# Patient Record
Sex: Male | Born: 1995 | Race: Black or African American | Hispanic: No | Marital: Single | Smoking: Never smoker
Health system: Southern US, Community
[De-identification: ages and names within clinical notes are randomized; demographics above are authoritative.]

## PROBLEM LIST (undated history)

## (undated) ENCOUNTER — Emergency Department: Admission: EM | Payer: Self-pay

## (undated) DIAGNOSIS — E119 Type 2 diabetes mellitus without complications: Secondary | ICD-10-CM

---
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
## 2016-12-10 ENCOUNTER — Ambulatory Visit: Payer: Self-pay | Admitting: Adult Health Nurse Practitioner

## 2016-12-10 DIAGNOSIS — E119 Type 2 diabetes mellitus without complications: Secondary | ICD-10-CM

## 2016-12-10 DIAGNOSIS — Z0000 Encounter for general adult medical examination without abnormal findings: Secondary | ICD-10-CM | POA: Insufficient documentation

## 2016-12-10 NOTE — Addendum Note (Signed)
Addended by: Dustin FlockMAYNOR, Shyrl Obi D on: 12/10/2016 07:46 PM   Modules accepted: Orders

## 2016-12-10 NOTE — Progress Notes (Signed)
  Patient: Peter Barron Male    DOB: 09/01/1995   21 y.o.   MRN: [id number] Visit Date: 12/10/2016  Today's Provider: [facility]   Chief Complaint  Patient presents with  . Diabetes   Subjective:    HPI  Pt states that he is not taking any medications at this time.  Was diagnosed with DM at age 21.  Pt states he was on 70/30 40 in the am and 20 at night.  Reports sugar has been running 160-170 in the am.  Not experiencing hypoglycemia.      Grandma with DM, CVA.  No family hx of cancer.   No Known Allergies Previous Medications   No medications on file    Review of Systems  All other systems reviewed and are negative.   Social History  Substance Use Topics  . Smoking status: Never Smoker  . Smokeless tobacco: Never Used  . Alcohol use Not on file   Objective:   BP (!) 144/84   Pulse 66   Temp 99.3 F (37.4 C)   Wt 207 lb (93.9 kg)   Physical Exam  Constitutional: He is oriented to person, place, and time. He appears well-developed and well-nourished.  HENT:  Head: Normocephalic and atraumatic.  Eyes: Pupils are equal, round, and reactive to light.  Neck: Normal range of motion. Neck supple.  Cardiovascular: Normal rate, regular rhythm and normal heart sounds.   Pulmonary/Chest: Effort normal and breath sounds normal.  Abdominal: Soft. Bowel sounds are normal.  Musculoskeletal: Normal range of motion.  Neurological: He is oriented to person, place, and time.  Skin: Skin is warm and dry.  Psychiatric: He has a normal mood and affect.  Vitals reviewed.       Assessment & Plan:     Routine labs ordered.   Continue to check CBG fasting.  Meter given.   FU in 2 weeks for BP check and to review labs.       [facility]   [facility] [place]

## 2016-12-10 NOTE — Addendum Note (Signed)
Addended by: Dustin FlockMAYNOR, Terrilee Dudzik D on: 12/10/2016 07:41 PM   Modules accepted: Orders

## 2016-12-11 LAB — COMPREHENSIVE METABOLIC PANEL
A/G RATIO: 1.6 (ref 1.2–2.2)
ALK PHOS: 61 IU/L (ref 39–117)
ALT: 10 IU/L (ref 0–44)
AST: 12 IU/L (ref 0–40)
Albumin: 4.8 g/dL (ref 3.5–5.5)
BILIRUBIN TOTAL: 0.4 mg/dL (ref 0.0–1.2)
BUN/Creatinine Ratio: 14 (ref 9–20)
BUN: 13 mg/dL (ref 6–20)
CALCIUM: 9.6 mg/dL (ref 8.7–10.2)
CHLORIDE: 101 mmol/L (ref 96–106)
CO2: 24 mmol/L (ref 20–29)
Creatinine, Ser: 0.93 mg/dL (ref 0.76–1.27)
GFR calc Af Amer: 135 mL/min/{1.73_m2} (ref >59)
GFR calc non Af Amer: 117 mL/min/{1.73_m2} (ref >59)
GLOBULIN, TOTAL: 3 g/dL (ref 1.5–4.5)
Glucose: 94 mg/dL (ref 65–99)
POTASSIUM: 4.3 mmol/L (ref 3.5–5.2)
SODIUM: 143 mmol/L (ref 134–144)
Total Protein: 7.8 g/dL (ref 6.0–8.5)

## 2016-12-11 LAB — CBC
HEMOGLOBIN: 14.8 g/dL (ref 13.0–17.7)
Hematocrit: 45.1 % (ref 37.5–51.0)
MCH: 27.9 pg (ref 26.6–33.0)
MCHC: 32.8 g/dL (ref 31.5–35.7)
MCV: 85 fL (ref 79–97)
PLATELETS: 235 10*3/uL (ref 150–379)
RBC: 5.3 x10E6/uL (ref 4.14–5.80)
RDW: 13.7 % (ref 12.3–15.4)
WBC: 6.6 10*3/uL (ref 3.4–10.8)

## 2016-12-11 LAB — HEMOGLOBIN A1C
ESTIMATED AVERAGE GLUCOSE: 128 mg/dL
HEMOGLOBIN A1C: 6.1 % — AB (ref 4.8–5.6)

## 2016-12-11 LAB — TSH: TSH: 2.23 u[IU]/mL (ref 0.450–4.500)

## 2016-12-24 ENCOUNTER — Ambulatory Visit: Payer: Self-pay | Admitting: Adult Health Nurse Practitioner

## 2016-12-24 VITALS — BP 140/77 | HR 64 | Wt 207.0 lb

## 2016-12-24 DIAGNOSIS — E119 Type 2 diabetes mellitus without complications: Secondary | ICD-10-CM

## 2016-12-24 LAB — GLUCOSE, POCT (MANUAL RESULT ENTRY): POC Glucose: 73 mg/dL (ref 70–99)

## 2016-12-24 MED ORDER — INSULIN ASPART PROT & ASPART (70-30 MIX) 100 UNIT/ML ~~LOC~~ SUSP: SUBCUTANEOUS | 11 refills | Status: AC

## 2016-12-24 NOTE — Progress Notes (Signed)
  Patient: Peter Barron Male    DOB: Mar 22, 1996   20 y.o.   MRN: [id number] Visit Date: 12/24/2016  Today's Provider: Jacelyn Pieah Doles-Johnson, NP   Chief Complaint  Patient presents with  . Diabetes   Subjective:    HPI  Here for BP check and lab review.  Last visit BP was 144/84.  Denies HA, dizziness.    A1C is 6.1  Taking 70/30 40 units in the am and 20 at night.  Denies any hypoglycemia.      No Known Allergies Previous Medications   No medications on file    Review of Systems  All other systems reviewed and are negative.   Social History  Substance Use Topics  . Smoking status: Never Smoker  . Smokeless tobacco: Never Used  . Alcohol use Not on file   Objective:   BP 140/77 (BP Location: Left Arm, Patient Position: Sitting)   Pulse 64   Wt 207 lb (93.9 kg)   Physical Exam  Constitutional: He appears well-developed and well-nourished.  Cardiovascular: Normal rate, regular rhythm and normal heart sounds.   Pulmonary/Chest: Effort normal and breath sounds normal.  Vitals reviewed.       Assessment & Plan:         DM:  Well Controlled.  Encourage diabetic diet and exercise.  Decrease 70/30 to 30 units in the am and 20 units at night.  Keep check CBG.  Would like 70/30 in a pen if available.    BP improved but still suboptimal.  Encouraged strict diet.  If not improved will start on HCTZ.    Jacelyn Pieah Doles-Johnson, NP   [facility] [place]

## 2016-12-24 NOTE — Patient Instructions (Signed)
DASH Eating Plan DASH stands for "Dietary Approaches to Stop Hypertension." The DASH eating plan is a healthy eating plan that has been shown to reduce high blood pressure (hypertension). It may also reduce your risk for type 2 diabetes, heart disease, and stroke. The DASH eating plan may also help with weight loss. What are tips for following this plan? General guidelines  Avoid eating more than 2,300 mg (milligrams) of salt (sodium) a day. If you have hypertension, you may need to reduce your sodium intake to 1,500 mg a day.  Limit alcohol intake to no more than 1 drink a day for nonpregnant women and 2 drinks a day for men. One drink equals 12 oz of beer, 5 oz of wine, or 1 oz of hard liquor.  Work with your health care provider to maintain a healthy body weight or to lose weight. Ask what an ideal weight is for you.  Get at least 30 minutes of exercise that causes your heart to beat faster (aerobic exercise) most days of the week. Activities may include walking, swimming, or biking.  Work with your health care provider or diet and nutrition specialist (dietitian) to adjust your eating plan to your individual calorie needs. Reading food labels  Check food labels for the amount of sodium per serving. Choose foods with less than 5 percent of the Daily Value of sodium. Generally, foods with less than 300 mg of sodium per serving fit into this eating plan.  To find whole grains, look for the word "whole" as the first word in the ingredient list. Shopping  Buy products labeled as "low-sodium" or "no salt added."  Buy fresh foods. Avoid canned foods and premade or frozen meals. Cooking  Avoid adding salt when cooking. Use salt-free seasonings or herbs instead of table salt or sea salt. Check with your health care provider or pharmacist before using salt substitutes.  Do not fry foods. Cook foods using healthy methods such as baking, boiling, grilling, and broiling instead.  Cook with  heart-healthy oils, such as olive, canola, soybean, or sunflower oil. Meal planning   Eat a balanced diet that includes: ? 5 or more servings of fruits and vegetables each day. At each meal, try to fill half of your plate with fruits and vegetables. ? Up to 6-8 servings of whole grains each day. ? Less than 6 oz of lean meat, poultry, or fish each day. A 3-oz serving of meat is about the same size as a deck of cards. One egg equals 1 oz. ? 2 servings of low-fat dairy each day. ? A serving of nuts, seeds, or beans 5 times each week. ? Heart-healthy fats. Healthy fats called Omega-3 fatty acids are found in foods such as flaxseeds and coldwater fish, like sardines, salmon, and mackerel.  Limit how much you eat of the following: ? Canned or prepackaged foods. ? Food that is high in trans fat, such as fried foods. ? Food that is high in saturated fat, such as fatty meat. ? Sweets, desserts, sugary drinks, and other foods with added sugar. ? Full-fat dairy products.  Do not salt foods before eating.  Try to eat at least 2 vegetarian meals each week.  Eat more home-cooked food and less restaurant, buffet, and fast food.  When eating at a restaurant, ask that your food be prepared with less salt or no salt, if possible. What foods are recommended? The items listed may not be a complete list. Talk with your dietitian about what   dietary choices are best for you. Grains Whole-grain or whole-wheat bread. Whole-grain or whole-wheat pasta. Brown rice. Oatmeal. Quinoa. Bulgur. Whole-grain and low-sodium cereals. Pita bread. Low-fat, low-sodium crackers. Whole-wheat flour tortillas. Vegetables Fresh or frozen vegetables (raw, steamed, roasted, or grilled). Low-sodium or reduced-sodium tomato and vegetable juice. Low-sodium or reduced-sodium tomato sauce and tomato paste. Low-sodium or reduced-sodium canned vegetables. Fruits All fresh, dried, or frozen fruit. Canned fruit in natural juice (without  added sugar). Meat and other protein foods Skinless chicken or turkey. Ground chicken or turkey. Pork with fat trimmed off. Fish and seafood. Egg whites. Dried beans, peas, or lentils. Unsalted nuts, nut butters, and seeds. Unsalted canned beans. Lean cuts of beef with fat trimmed off. Low-sodium, lean deli meat. Dairy Low-fat (1%) or fat-free (skim) milk. Fat-free, low-fat, or reduced-fat cheeses. Nonfat, low-sodium ricotta or cottage cheese. Low-fat or nonfat yogurt. Low-fat, low-sodium cheese. Fats and oils Soft margarine without trans fats. Vegetable oil. Low-fat, reduced-fat, or light mayonnaise and salad dressings (reduced-sodium). Canola, safflower, olive, soybean, and sunflower oils. Avocado. Seasoning and other foods Herbs. Spices. Seasoning mixes without salt. Unsalted popcorn and pretzels. Fat-free sweets. What foods are not recommended? The items listed may not be a complete list. Talk with your dietitian about what dietary choices are best for you. Grains Baked goods made with fat, such as croissants, muffins, or some breads. Dry pasta or rice meal packs. Vegetables Creamed or fried vegetables. Vegetables in a cheese sauce. Regular canned vegetables (not low-sodium or reduced-sodium). Regular canned tomato sauce and paste (not low-sodium or reduced-sodium). Regular tomato and vegetable juice (not low-sodium or reduced-sodium). Pickles. Olives. Fruits Canned fruit in a light or heavy syrup. Fried fruit. Fruit in cream or butter sauce. Meat and other protein foods Fatty cuts of meat. Ribs. Fried meat. Bacon. Sausage. Bologna and other processed lunch meats. Salami. Fatback. Hotdogs. Bratwurst. Salted nuts and seeds. Canned beans with added salt. Canned or smoked fish. Whole eggs or egg yolks. Chicken or turkey with skin. Dairy Whole or 2% milk, cream, and half-and-half. Whole or full-fat cream cheese. Whole-fat or sweetened yogurt. Full-fat cheese. Nondairy creamers. Whipped toppings.  Processed cheese and cheese spreads. Fats and oils Butter. Stick margarine. Lard. Shortening. Ghee. Bacon fat. Tropical oils, such as coconut, palm kernel, or palm oil. Seasoning and other foods Salted popcorn and pretzels. Onion salt, garlic salt, seasoned salt, table salt, and sea salt. Worcestershire sauce. Tartar sauce. Barbecue sauce. Teriyaki sauce. Soy sauce, including reduced-sodium. Steak sauce. Canned and packaged gravies. Fish sauce. Oyster sauce. Cocktail sauce. Horseradish that you find on the shelf. Ketchup. Mustard. Meat flavorings and tenderizers. Bouillon cubes. Hot sauce and Tabasco sauce. Premade or packaged marinades. Premade or packaged taco seasonings. Relishes. Regular salad dressings. Where to find more information:  National Heart, Lung, and Blood Institute: www.nhlbi.nih.gov  American Heart Association: www.heart.org Summary  The DASH eating plan is a healthy eating plan that has been shown to reduce high blood pressure (hypertension). It may also reduce your risk for type 2 diabetes, heart disease, and stroke.  With the DASH eating plan, you should limit salt (sodium) intake to 2,300 mg a day. If you have hypertension, you may need to reduce your sodium intake to 1,500 mg a day.  When on the DASH eating plan, aim to eat more fresh fruits and vegetables, whole grains, lean proteins, low-fat dairy, and heart-healthy fats.  Work with your health care provider or diet and nutrition specialist (dietitian) to adjust your eating plan to your individual   calorie needs. This information is not intended to replace advice given to you by your health care provider. Make sure you discuss any questions you have with your health care provider. Document Released: 05/14/2011 Document Revised: 05/18/2016 Document Reviewed: 05/18/2016 Elsevier Interactive Patient Education  2017 Elsevier Inc.  

## 2017-01-05 ENCOUNTER — Ambulatory Visit: Payer: Self-pay | Admitting: Pharmacy Technician

## 2017-01-05 NOTE — Progress Notes (Signed)
Patient scheduled for eligibility appointment at [facility].  Patient did not show for the appointment on January 05, 2017 at 9:00a.m.  Called patient.  Patient stated that he wanted to sleep-in.  [facility] to [facility]che[facility] appointment after he woke-up.  Patient to come-in for eligibility on 01/19/17 at 3:30p.m.  [facility] unable to provide additional medication assistance until eligibility is determined.  Sherilyn DacostaBetty J. Chaunice Obie Care Manager [facility]

## 2017-01-19 ENCOUNTER — Ambulatory Visit: Payer: Self-pay | Admitting: Pharmacy Technician

## 2017-02-18 ENCOUNTER — Telehealth: Payer: Self-pay | Admitting: Pharmacy Technician

## 2017-02-18 NOTE — Telephone Encounter (Signed)
Patient eligible to receive medication assistance at [facility] through 2018, as long as eligibility requirements continue to be met.  Peter Barron Care Manager [facility] 

## 2017-03-15 ENCOUNTER — Ambulatory Visit: Payer: Self-pay

## 2017-04-01 ENCOUNTER — Ambulatory Visit: Payer: Self-pay

## 2017-05-10 ENCOUNTER — Telehealth: Payer: Self-pay | Admitting: Pharmacist

## 2017-05-10 NOTE — Telephone Encounter (Signed)
05/10/17 Mailing Denial letter to patient, we sent paperwork to patient 02/18/17 for patient to sign and return with a Medicaid denial for full Medicaid. Patient has not returned his portion. Also noting in [facility]Forde RadonAJ

## 2017-08-13 ENCOUNTER — Telehealth: Payer: Self-pay | Admitting: Pharmacy Technician

## 2017-08-13 NOTE — Telephone Encounter (Signed)
Patient failed to provide 2019 financial documentation.  No additional medication assistance will be provided by [facility] without the required proof of income documentation.  Patient notified by letter.  Betty J. Kluttz Care Manager [facility] 

## 2018-03-23 ENCOUNTER — Telehealth: Payer: Self-pay

## 2018-03-23 NOTE — Telephone Encounter (Signed)
-----   Message from Rolm Gala, NP sent at 03/15/2018  2:44 PM EDT ----- Please call and schedule an appointment for Mr Tozzi. Thank you

## 2018-03-23 NOTE — Telephone Encounter (Signed)
Called pt to see if pt would like to make a follow up appt, pt has not been seen in awhile. Wrong number in system.

## 2021-01-15 ENCOUNTER — Other Ambulatory Visit: Payer: Self-pay

## 2021-01-15 ENCOUNTER — Encounter: Payer: Self-pay | Admitting: Emergency Medicine

## 2021-01-15 DIAGNOSIS — Z794 Long term (current) use of insulin: Secondary | ICD-10-CM | POA: Insufficient documentation

## 2021-01-15 DIAGNOSIS — E1165 Type 2 diabetes mellitus with hyperglycemia: Secondary | ICD-10-CM | POA: Insufficient documentation

## 2021-01-15 DIAGNOSIS — Z7984 Long term (current) use of oral hypoglycemic drugs: Secondary | ICD-10-CM | POA: Insufficient documentation

## 2021-01-15 LAB — BASIC METABOLIC PANEL
Anion gap: 4 — ABNORMAL LOW (ref 5–15)
BUN: 8 mg/dL (ref 6–20)
CO2: 30 mmol/L (ref 22–32)
Calcium: 9.2 mg/dL (ref 8.9–10.3)
Chloride: 110 mmol/L (ref 98–111)
Creatinine, Ser: 0.77 mg/dL (ref 0.61–1.24)
GFR, Estimated: >60 mL/min (ref >60)
Glucose, Bld: 125 mg/dL — ABNORMAL HIGH (ref 70–99)
Potassium: 3.8 mmol/L (ref 3.5–5.1)
Sodium: 144 mmol/L (ref 135–145)

## 2021-01-15 LAB — CBC
HCT: 42.1 % (ref 39.0–52.0)
Hemoglobin: 14.6 g/dL (ref 13.0–17.0)
MCH: 29.3 pg (ref 26.0–34.0)
MCHC: 34.7 g/dL (ref 30.0–36.0)
MCV: 84.5 fL (ref 80.0–100.0)
Platelets: 261 10*3/uL (ref 150–400)
RBC: 4.98 MIL/uL (ref 4.22–5.81)
RDW: 13 % (ref 11.5–15.5)
WBC: 7.5 10*3/uL (ref 4.0–10.5)
nRBC: 0 % (ref 0.0–0.2)

## 2021-01-15 LAB — CBG MONITORING, ED: Glucose-Capillary: 109 mg/dL — ABNORMAL HIGH (ref 70–99)

## 2021-01-15 NOTE — ED Triage Notes (Signed)
Pt in via POV, reports sugars reading "high" x 2 days.  Reports being insulin dependent diabetic, out of insulin x approximately 2 months.  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  Vitals WDL, NAD noted.

## 2021-01-16 ENCOUNTER — Emergency Department
Admission: EM | Admit: 2021-01-16 | Discharge: 2021-01-16 | Disposition: A | Discharge status: Home / Self Care | Payer: Self-pay | Attending: Emergency Medicine | Admitting: Emergency Medicine

## 2021-01-16 DIAGNOSIS — R739 Hyperglycemia, unspecified: Secondary | ICD-10-CM

## 2021-01-16 HISTORY — DX: Type 2 diabetes mellitus without complications: E11.9

## 2021-01-16 LAB — URINALYSIS, COMPLETE (UACMP) WITH MICROSCOPIC
Bacteria, UA: NONE SEEN
Bilirubin Urine: NEGATIVE
Glucose, UA: 50 mg/dL — AB
Hgb urine dipstick: NEGATIVE
Ketones, ur: NEGATIVE mg/dL
Leukocytes,Ua: NEGATIVE
Nitrite: NEGATIVE
Protein, ur: NEGATIVE mg/dL
Specific Gravity, Urine: 1.017 (ref 1.005–1.030)
Squamous Epithelial / HPF: NONE SEEN (ref 0–5)
pH: 6 (ref 5.0–8.0)

## 2021-01-16 MED ORDER — METFORMIN HCL 500 MG PO TABS: 500.0000 mg | ORAL_TABLET | Freq: Two times a day (BID) | ORAL | 1 refills | Status: AC

## 2021-01-16 NOTE — ED Provider Notes (Signed)
[facility] Emergency Department Provider Note  ____________________________________________   Event Date/Time   First MD Initiated Contact with Patient 01/16/21 0040     (approximate)  I have reviewed the triage vital signs and the nursing notes.   HISTORY  Chief Complaint Hyperglycemia    HPI Peter Barron is a 25 y.o. male with history of type 2 insulin-dependent diabetes who presents to the emergency department concerns for hyperglycemia.  Reports over this past several days he has had fasting blood sugars that have read "high".  He has been out of his medications for about 2 months.  States he was on Humulin 70/30 and took 45 units in the morning and 25 units at night.  States he just moved here from [place] and does not yet have a primary care doctor or insurance.  States he has had intermittent blurry vision in the morning when his blood sugars are high as well as polyuria.  No polydipsia, nausea or vomiting.  No symptoms currently.  States he was hoping to get a prescription for metformin until he can get insurance and a primary care doctor.       Past Medical History:  Diagnosis Date   Diabetes mellitus without complication [facility])     Patient Active Problem List   Diagnosis Date Noted   Diabetes mellitus without complication (HCC) 12/10/2016   Healthcare maintenance 12/10/2016    History reviewed. No pertinent surgical history.  Prior to Admission medications   Medication Sig Start Date End Date Taking? Authorizing Provider  metFORMIN (GLUCOPHAGE) 500 MG tablet Take 1 tablet (500 mg total) by mouth 2 (two) times daily with a meal. 01/16/21 01/16/22 Yes Ola Raap, Baxter Hire N, DO  insulin aspart protamine- aspart (NOVOLOG MIX 70/30) (70-30) 100 UNIT/ML injection 30 units in the morning, 20 units at night 12/24/16   Doles-Johnson, Teah, NP    Allergies Patient has no known allergies.  No family history on file.  Social History Social  History   Tobacco Use   Smoking status: Never   Smokeless tobacco: Never  Vaping Use   Vaping Use: Never used  Substance Use Topics   Alcohol use: Never   Drug use: Never    Review of Systems Constitutional: No fever. Eyes: No visual changes. ENT: No sore throat. Cardiovascular: Denies chest pain. Respiratory: Denies shortness of breath. Gastrointestinal: No nausea, vomiting, diarrhea. Genitourinary: Negative for dysuria. Musculoskeletal: Negative for back pain. Skin: Negative for rash. Neurological: Negative for focal weakness or numbness.  ____________________________________________   PHYSICAL EXAM:  VITAL SIGNS: ED Triage Vitals  Enc Vitals Group     BP 01/15/21 2130 138/79     Pulse Rate 01/15/21 2130 75     Resp 01/15/21 2130 16     Temp 01/15/21 2130 99 F (37.2 C)     Temp Source 01/15/21 2130 Oral     SpO2 01/15/21 2130 97 %     Weight 01/15/21 2131 200 lb (90.7 kg)     Height 01/15/21 2131 6' (1.829 m)     Head Circumference --      Peak Flow --      Pain Score 01/15/21 2131 0     Pain Loc --      Pain Edu? --      Excl. in GC? --    CONSTITUTIONAL: Alert and oriented and responds appropriately to questions. Well-appearing; well-nourished HEAD: Normocephalic EYES: Conjunctivae clear, pupils appear equal, EOM appear intact ENT: normal nose; moist mucous  membranes NECK: Supple, normal ROM CARD: RRR; S1 and S2 appreciated; no murmurs, no clicks, no rubs, no gallops RESP: Normal chest excursion without splinting or tachypnea; breath sounds clear and equal bilaterally; no wheezes, no rhonchi, no rales, no hypoxia or respiratory distress, speaking full sentences ABD/GI: Normal bowel sounds; non-distended; soft, non-tender, no rebound, no guarding, no peritoneal signs, no hepatosplenomegaly BACK: The back appears normal EXT: Normal ROM in all joints; no deformity noted, no edema; no cyanosis SKIN: Normal color for age and race; warm; no rash on exposed  skin NEURO: Moves all extremities equally PSYCH: The patient's mood and manner are appropriate.  ____________________________________________   LABS (all labs ordered are listed, but only abnormal results are displayed)  Labs Reviewed  BASIC METABOLIC PANEL - Abnormal; Notable for the following components:      Result Value   Glucose, Bld 125 (*)    Anion gap 4 (*)    All other components within normal limits  CBG MONITORING, ED - Abnormal; Notable for the following components:   Glucose-Capillary 109 (*)    All other components within normal limits  CBC  URINALYSIS, COMPLETE (UACMP) WITH MICROSCOPIC  CBG MONITORING, ED  CBG MONITORING, ED   ____________________________________________  EKG   ____________________________________________  RADIOLOGY I, Eulas Schweitzer, personally viewed and evaluated these images (plain radiographs) as part of my medical decision making, as well as reviewing the written report by the radiologist.  ED MD interpretation:    Official radiology report(s): No results found.  ____________________________________________   PROCEDURES  Procedure(s) performed (including Critical Care):  Procedures   ____________________________________________   INITIAL IMPRESSION / ASSESSMENT AND PLAN / ED COURSE  As part of my medical decision making, I reviewed the following data within the electronic MEDICAL RECORD NUMBER Nursing notes reviewed and incorporated and Labs reviewed          Patient here with complaints of hyperglycemia.  Currently blood sugars in the low 100s.  He is asymptomatic.  Labs are not suggestive of DKA.  Discussed with patient that I am happy to prescribe him metformin until he can get a primary care doctor given his blood sugar is well controlled currently that he may not need insulin.  He verbalized understanding.  Will provide with work note as well for today.  Discussed return precautions.  Patient comfortable with this  plan.  At this time, I do not feel there is any life-threatening condition present. I have reviewed, interpreted and discussed all results (EKG, imaging, lab, urine as appropriate) and exam findings with patient/family. I have reviewed nursing notes and appropriate previous records.  I feel the patient is safe to be discharged home without further emergent workup and can continue workup as an outpatient as needed. Discussed usual and customary return precautions. Patient/family verbalize understanding and are comfortable with this plan.  Outpatient follow-up has been provided as needed. All questions have been answered.  ____________________________________________   FINAL CLINICAL IMPRESSION(S) / ED DIAGNOSES  Final diagnoses:  Hyperglycemia     ED Discharge Orders          Ordered    metFORMIN (GLUCOPHAGE) 500 MG tablet  2 times daily with meals        01/16/21 0046            *Please note:  Peter Barron was evaluated in Emergency Department on 01/16/2021 for the symptoms described in the history of present illness. He was evaluated in the context of the global COVID-19 pandemic,  which necessitated consideration that the patient might be at risk for infection with the SARS-CoV-2 virus that causes COVID-19. Institutional protocols and algorithms that pertain to the evaluation of patients at risk for COVID-19 are in a state of rapid change based on information released by regulatory bodies including the CDC and federal and state organizations. These policies and algorithms were followed during the patient's care in the ED.  Some ED evaluations and interventions may be delayed as a result of limited staffing during and the pandemic.*   Note:  This document was prepared using Dragon voice recognition software and may include unintentional dictation errors.    Thea Holshouser, Layla Maw, DO 01/16/21 [phone]

## 2021-01-16 NOTE — Discharge Instructions (Addendum)
Steps to find a Primary Care Provider (PCP):  Call [phone] or [phone] to access "[facility] Find a Doctor Service."  2.  You may also go on the [facility] website at [email or web address]  

## 2021-05-18 ENCOUNTER — Encounter: Payer: Self-pay | Admitting: Emergency Medicine

## 2021-05-18 ENCOUNTER — Emergency Department
Admission: EM | Admit: 2021-05-18 | Discharge: 2021-05-18 | Disposition: A | Discharge status: Home / Self Care | Payer: Self-pay | Attending: Emergency Medicine | Admitting: Emergency Medicine

## 2021-05-18 ENCOUNTER — Other Ambulatory Visit: Payer: Self-pay

## 2021-05-18 DIAGNOSIS — J069 Acute upper respiratory infection, unspecified: Secondary | ICD-10-CM | POA: Insufficient documentation

## 2021-05-18 DIAGNOSIS — E119 Type 2 diabetes mellitus without complications: Secondary | ICD-10-CM | POA: Insufficient documentation

## 2021-05-18 DIAGNOSIS — Z20822 Contact with and (suspected) exposure to covid-19: Secondary | ICD-10-CM | POA: Insufficient documentation

## 2021-05-18 DIAGNOSIS — J40 Bronchitis, not specified as acute or chronic: Secondary | ICD-10-CM | POA: Insufficient documentation

## 2021-05-18 DIAGNOSIS — Z7984 Long term (current) use of oral hypoglycemic drugs: Secondary | ICD-10-CM | POA: Insufficient documentation

## 2021-05-18 DIAGNOSIS — Z794 Long term (current) use of insulin: Secondary | ICD-10-CM | POA: Insufficient documentation

## 2021-05-18 LAB — RESP PANEL BY RT-PCR (FLU A&B, COVID) ARPGX2
Influenza A by PCR: NEGATIVE
Influenza B by PCR: NEGATIVE
SARS Coronavirus 2 by RT PCR: NEGATIVE

## 2021-05-18 MED ORDER — DOXYCYCLINE MONOHYDRATE 50 MG PO CAPS
100.0000 mg | ORAL_CAPSULE | Freq: Two times a day (BID) | ORAL | 0 refills | Status: AC
  Filled 2021-05-18: qty 20, 5d supply, fill #0

## 2021-05-18 NOTE — ED Triage Notes (Signed)
Pt reports sore throat, fever, cough and congestion for over a week. Pt states does feel better than he did last week but still feels bad

## 2021-05-18 NOTE — ED Provider Notes (Signed)
[facility] Emergency Department Provider Note   ____________________________________________   Event Date/Time   First MD Initiated Contact with Patient 05/18/21 1827     (approximate)  I have reviewed the triage vital signs and the nursing notes.   HISTORY  Chief Complaint Fever, Cough, and Chills    HPI Peter Barron is a 25 y.o. male who presents for fever, cough, and chills  LOCATION: Generalized DURATION: 1 week prior to arrival TIMING: Worsening since onset SEVERITY: Severe QUALITY: Fever, cough, and chills CONTEXT: Patient states that he began having fever, cough, and chills approximately 1 week prior to arrival that initially improved but now is worsening MODIFYING FACTORS: Denies any exacerbating or relieving factors and has taken no medications for the symptoms ASSOCIATED SYMPTOMS: Clear rhinorrhea   Per medical record review, patient has history of type 2 diabetes          Past Medical History:  Diagnosis Date   Diabetes mellitus without complication [facility])     Patient Active Problem List   Diagnosis Date Noted   Diabetes mellitus without complication (HCC) 12/10/2016   Healthcare maintenance 12/10/2016    History reviewed. No pertinent surgical history.  Prior to Admission medications   Medication Sig Start Date End Date Taking? Authorizing Provider  doxycycline (VIBRAMYCIN) 50 MG capsule Take 2 capsules (100 mg total) by mouth 2 (two) times daily for 5 days. 05/18/21 05/23/21 Yes Merwyn Katos, MD  insulin aspart protamine- aspart (NOVOLOG MIX 70/30) (70-30) 100 UNIT/ML injection 30 units in the morning, 20 units at night 12/24/16   Doles-Johnson, Teah, NP  metFORMIN (GLUCOPHAGE) 500 MG tablet Take 1 tablet (500 mg total) by mouth 2 (two) times daily with a meal. 01/16/21 01/16/22  Ward, Layla Maw, DO    Allergies Patient has no known allergies.  No family history on file.  Social History Social History    Tobacco Use   Smoking status: Never   Smokeless tobacco: Never  Vaping Use   Vaping Use: Never used  Substance Use Topics   Alcohol use: Never   Drug use: Never    Review of Systems Constitutional: Endorses fever/chills Eyes: No visual changes. ENT: Endorses sore throat, and cough Cardiovascular: Denies chest pain. Respiratory: Denies shortness of breath. Gastrointestinal: No abdominal pain.  No nausea, no vomiting.  No diarrhea. Genitourinary: Negative for dysuria. Musculoskeletal: Negative for acute arthralgias Skin: Negative for rash. Neurological: Negative for headaches, weakness/numbness/paresthesias in any extremity Psychiatric: Negative for suicidal ideation/homicidal ideation   ____________________________________________   PHYSICAL EXAM:  VITAL SIGNS: ED Triage Vitals  Enc Vitals Group     BP 05/18/21 2013 115/85     Pulse Rate 05/18/21 2013 82     Resp 05/18/21 2013 18     Temp 05/18/21 2013 98.5 F (36.9 C)     Temp Source 05/18/21 2013 Oral     SpO2 05/18/21 2013 97 %     Weight 05/18/21 1541 210 lb (95.3 kg)     Height 05/18/21 1541 6' (1.829 m)     Head Circumference --      Peak Flow --      Pain Score 05/18/21 1540 4     Pain Loc --      Pain Edu? --      Excl. in GC? --    Constitutional: Alert and oriented. Well appearing and in no acute distress. Eyes: Conjunctivae are normal. PERRL. Head: Atraumatic. Nose: No congestion/rhinnorhea. Mouth/Throat: Mucous membranes are moist.  Neck: No stridor Cardiovascular: Grossly normal heart sounds.  Good peripheral circulation. Respiratory: Normal respiratory effort.  No retractions. Gastrointestinal: Soft and nontender. No distention. Musculoskeletal: No obvious deformities Neurologic:  Normal speech and language. No gross focal neurologic deficits are appreciated. Skin:  Skin is warm and diaphoretic. No rash noted. Psychiatric: Mood and affect are normal. Speech and behavior are  normal.  ____________________________________________   LABS (all labs ordered are listed, but only abnormal results are displayed)  Labs Reviewed  RESP PANEL BY RT-PCR (FLU A&B, COVID) [id number]    PROCEDURES  Procedure(s) performed (including Critical Care):  .1-3 Lead EKG Interpretation Performed by: Merwyn Katos, MD Authorized by: Merwyn Katos, MD     Interpretation: normal     ECG rate:  81   ECG rate assessment: normal     Rhythm: sinus rhythm     Ectopy: none     Conduction: normal     ____________________________________________   INITIAL IMPRESSION / ASSESSMENT AND PLAN / ED COURSE  As part of my medical decision making, I reviewed the following data within the electronic medical record, if available:  Nursing notes reviewed and incorporated, Labs reviewed, EKG interpreted, Old chart reviewed, Radiograph reviewed and Notes from prior ED visits reviewed and incorporated        Otherwise healthy patient presenting with constellation of symptoms likely representing uncomplicated viral upper respiratory symptoms as characterized by mild pharyngitis  Unlikely PTA/RPA: no hot potato voice, no uvular deviation, Unlikely Esophageal rupture: No history of dysphagia Unlikely deep space infection/Ludwigs Low suspicion for CNS infection bacterial sinusitis, or pneumonia given exam and history.  Unlikely Strep or EBV as centor negative and with no pharyngeal exudate, posterior LAD, or splenomegaly.  Will attempt to alleviate symptoms conservatively; no overt indications at this time for antibiotics. No respiratory distress, otherwise relatively well appearing and nontoxic. Will discuss prompt follow up with PMD and strict return precautions.      ____________________________________________   FINAL CLINICAL IMPRESSION(S) / ED DIAGNOSES  Final diagnoses:  Bronchitis  Upper respiratory tract infection, unspecified type     ED Discharge Orders           Ordered    doxycycline (VIBRAMYCIN) 50 MG capsule  2 times daily        05/18/21 2048             Note:  This document was prepared using Dragon voice recognition software and may include unintentional dictation errors.    Merwyn Katos, MD 05/18/21 [phone]

## 2021-05-19 ENCOUNTER — Other Ambulatory Visit: Payer: Self-pay

## 2021-05-20 ENCOUNTER — Other Ambulatory Visit: Payer: Self-pay

## 2021-06-23 ENCOUNTER — Other Ambulatory Visit: Payer: Self-pay

## 2021-07-14 ENCOUNTER — Other Ambulatory Visit: Payer: Self-pay

## 2021-08-20 ENCOUNTER — Other Ambulatory Visit: Payer: Self-pay

## 2024-01-03 ENCOUNTER — Emergency Department
Admission: EM | Admit: 2024-01-03 | Discharge: 2024-01-03 | Disposition: A | Discharge status: Home / Self Care | Payer: Self-pay | Attending: Emergency Medicine | Admitting: Emergency Medicine

## 2024-01-03 ENCOUNTER — Other Ambulatory Visit: Payer: Self-pay

## 2024-01-03 DIAGNOSIS — Z0279 Encounter for issue of other medical certificate: Secondary | ICD-10-CM | POA: Insufficient documentation

## 2024-01-03 DIAGNOSIS — Z008 Encounter for other general examination: Secondary | ICD-10-CM

## 2024-01-03 NOTE — ED Provider Notes (Signed)
 [facility] Provider Note    Event Date/Time   First MD Initiated Contact with Patient 01/03/24 1105     (approximate)   History   Letter for School/Work   HPI  Peter Barron is a 28 y.o. male presents emergency department requesting work note.  Patient originally saw a Worker's Comp. doctor for his left elbow pain.  Patient had clocked out, went to his car and hit his elbow on his car door.  Was evaluated by [employer]. physician.  Placed on work restrictions.  Now his company has told him that he cannot be on [employer]. as it was denied.  And he needs a note saying he can return to work.      Physical Exam   Triage Vital Signs: ED Triage Vitals  Encounter Vitals Group     BP 01/03/24 1016 119/82     Girls Systolic BP Percentile --      Girls Diastolic BP Percentile --      Boys Systolic BP Percentile --      Boys Diastolic BP Percentile --      Pulse Rate 01/03/24 1016 (!) 102     Resp 01/03/24 1016 18     Temp 01/03/24 1016 98.2 F (36.8 C)     Temp Source 01/03/24 1016 Oral     SpO2 --      Weight 01/03/24 1017 209 lb 7 oz (95 kg)     Height 01/03/24 1017 6' (1.829 m)     Head Circumference --      Peak Flow --      Pain Score 01/03/24 1017 0     Pain Loc --      Pain Education --      Exclude from Growth Chart --     Most recent vital signs: Vitals:   01/03/24 1016  BP: 119/82  Pulse: (!) 102  Resp: 18  Temp: 98.2 F (36.8 C)     General: Awake, no distress.   CV:  Good peripheral perfusion.  Resp:  Normal effort.  Abd:  No distention.   Other:  Full range of motion of the left elbow, neurovascular intact   ED Results / Procedures / Treatments   Labs (all labs ordered are listed, but only abnormal results are displayed) Labs Reviewed - No data to display   EKG     RADIOLOGY     PROCEDURES:   Procedures  Critical Care:  no Chief Complaint  Patient presents with   Letter for School/Work       MEDICATIONS ORDERED IN ED: Medications - No data to display   IMPRESSION / MDM / ASSESSMENT AND PLAN / ED COURSE  I reviewed the triage vital signs and the nursing notes.                              Differential diagnosis includes, but is not limited to, encounter for work evaluation, elbow contusion, strain  Patient's presentation is most consistent with acute, uncomplicated illness.   Work note provided for the patient.  Do not feel he needs to be on work restrictions at this time.  Follow-up with his regular doctor if needed.  Return for worsening      FINAL CLINICAL IMPRESSION(S) / ED DIAGNOSES   Final diagnoses:  Encounter for work capability assessment     Rx / DC Orders   ED Discharge Orders  None        Note:  This document was prepared using Dragon voice recognition software and may include unintentional dictation errors.    Gasper Devere ORN, PA-C 01/03/24 1137    Claudene Rover, MD 01/03/24 [phone]

## 2024-01-03 NOTE — ED Triage Notes (Signed)
 Pt states he needs a work note to return to work, pt already seen for elbow pain. NAD noted.

## 2024-01-03 NOTE — ED Notes (Signed)
 See triage note  Presents requesting a note to be able to return to work  States he has a note from w/c  States he hit his elbow when leaving work He was told this was not w/c injury

## 2024-02-21 ENCOUNTER — Encounter: Payer: Self-pay | Admitting: Family Medicine

## 2024-02-21 ENCOUNTER — Ambulatory Visit (INDEPENDENT_AMBULATORY_CARE_PROVIDER_SITE_OTHER): Payer: Self-pay | Admitting: Family Medicine

## 2024-02-21 ENCOUNTER — Other Ambulatory Visit: Payer: Self-pay

## 2024-02-21 VITALS — BP 120/68 | HR 95 | Ht 72.0 in | Wt 209.2 lb

## 2024-02-21 DIAGNOSIS — Z794 Long term (current) use of insulin: Secondary | ICD-10-CM

## 2024-02-21 DIAGNOSIS — Z13220 Encounter for screening for lipoid disorders: Secondary | ICD-10-CM

## 2024-02-21 DIAGNOSIS — E1165 Type 2 diabetes mellitus with hyperglycemia: Secondary | ICD-10-CM

## 2024-02-21 DIAGNOSIS — Z136 Encounter for screening for cardiovascular disorders: Secondary | ICD-10-CM

## 2024-02-21 DIAGNOSIS — Z130 Encounter for screening for diseases of the blood and blood-forming organs and certain disorders involving the immune mechanism: Secondary | ICD-10-CM

## 2024-02-21 LAB — POCT GLYCOSYLATED HEMOGLOBIN (HGB A1C): Hemoglobin A1C: 9.8 % — AB (ref 4.0–5.6)

## 2024-02-21 MED ORDER — BLOOD GLUCOSE MONITOR SYSTEM W/DEVICE KIT
1.0000 | PACK | Freq: Three times a day (TID) | 0 refills | Status: AC
  Filled 2024-02-21: qty 1, 30d supply, fill #0

## 2024-02-21 MED ORDER — ACCU-CHEK SOFTCLIX LANCETS MISC
1.0000 | Freq: Three times a day (TID) | 0 refills | Status: AC
  Filled 2024-02-21: qty 100, 30d supply, fill #0

## 2024-02-21 MED ORDER — INSULIN ISOPHANE & REGULAR (HUMAN 70-30)100 UNIT/ML KWIKPEN
10.0000 [IU] | PEN_INJECTOR | Freq: Two times a day (BID) | SUBCUTANEOUS | 0 refills | Status: AC
Start: 2024-02-21 — End: ?
  Filled 2024-02-21: qty 15, 75d supply, fill #0

## 2024-02-21 MED ORDER — BLOOD GLUCOSE TEST VI STRP
1.0000 | ORAL_STRIP | Freq: Three times a day (TID) | 0 refills | Status: AC
  Filled 2024-02-21: qty 100, 34d supply, fill #0

## 2024-02-21 MED ORDER — LANCET DEVICE MISC
1.0000 | Freq: Three times a day (TID) | 0 refills | Status: AC
  Filled 2024-02-21: qty 1, 30d supply, fill #0

## 2024-02-21 NOTE — Progress Notes (Signed)
 New Patient Office Visit  Subjective    Patient ID: Peter Barron, male    DOB: 1995-10-08  Age: 28 y.o. MRN: [id number]  CC:  Chief Complaint  Patient presents with   Establish Care    Patient presents today to establish care. He is a diabetic. He has not been on any insulin  in a couple of years. So he would like to start back taking care of his diabetes.      Assessment & Plan:   Type 2 diabetes mellitus with hyperglycemia, unspecified whether long term insulin  use (HCC) -     Insulin  NPH Isophane & Regular; Inject 10 Units into the skin in the morning and at bedtime.  Dispense: 15 mL; Refill: 0 -     Comprehensive metabolic panel with GFR -     Ambulatory referral to Endocrinology -     Blood Glucose Monitor System; Use to check blood sugar levels in the morning, at noon, and at bedtime.  Dispense: 1 kit; Refill: 0 -     Blood Glucose Test; Use to check blood sugar levels in the morning, at noon, and at bedtime.  Dispense: 100 strip; Refill: 0 -     Lancet Device; 1 each by Does not apply route in the morning, at noon, and at bedtime. May substitute to any manufacturer covered by patient's insurance.  Dispense: 1 each; Refill: 0 -     Accu-Chek Softclix Lancets; Use to check blood sugar levels in the morning, at noon, and at bedtime.  Dispense: 100 each; Refill: 0  Encounter for lipid screening for cardiovascular disease -     Lipid panel  Screening for iron deficiency anemia -     CBC with Differential/Platelet   Assessment and Plan Assessment & Plan Type 2 diabetes mellitus with hyperglycemia Poorly controlled with A1c of 9.8%. Uncertainty about type 1 or type 2 diabetes due to young age at diagnosis. Off medication for a few years. - Start Humulin  10 units morning and evening. - Refer to endocrinologist for further evaluation and management. - Advised to check blood sugars three times daily and eat before insulin  administration.    No follow-ups on file.   Peter  K Dusten Ellinwood, MD     Peter Barron presents to establish care  Discussed the use of AI scribe software for clinical note transcription with the patient, who gave verbal consent to proceed.  History of Present Illness Peter Barron is a 28 year old male with diabetes who presents for management of his diabetes.  He has had diabetes for over ten years, initially diagnosed as type 2. He has been off diabetes medication for a few years due to not having a primary doctor and issues with insurance. Per chart review he previously used Humulin  70/30, taking 45 units in the morning and 25 at night, but he does not recall the specific insulin  type. His last recorded A1c unknown. He has not been able to access free clinics due to insurance requirements.  He recently moved back to the area in 2021 from [place]  and is currently working at the [employer] center. He has been managing his diabetes without medication and is seeking to re-establish care to address his condition.  There is a family history of diabetes on both his mother's and father's sides, with his mother currently living in the area.   Outpatient Encounter Medications as of 02/21/2024  Medication Sig   Accu-Chek Softclix Lancets lancets Use to check  blood sugar levels in the morning, at noon, and at bedtime.   Blood Glucose Monitoring Suppl (BLOOD GLUCOSE MONITOR SYSTEM) w/Device KIT Use to check blood sugar levels in the morning, at noon, and at bedtime.   Glucose Blood (BLOOD GLUCOSE TEST STRIPS) STRP Use to check blood sugar levels in the morning, at noon, and at bedtime.   insulin  isophane & regular human KwikPen (HUMULIN  70/30 MIX) (70-30) 100 UNIT/ML KwikPen Inject 10 Units into the skin in the morning and at bedtime.   Lancet Device MISC 1 each by Does not apply route in the morning, at noon, and at bedtime. May substitute to any manufacturer covered by patient's insurance.   insulin  aspart protamine- aspart (NOVOLOG   MIX 70/30) (70-30) 100 UNIT/ML injection 30 units in the morning, 20 units at night (Patient not taking: Reported on 02/21/2024)   metFORMIN  (GLUCOPHAGE ) 500 MG tablet Take 1 tablet (500 mg total) by mouth 2 (two) times daily with a meal. (Patient not taking: Reported on 02/21/2024)   No facility-administered encounter medications on file as of 02/21/2024.      Review of Systems  All other systems reviewed and are negative.       Objective    BP 120/68   Pulse 95   Ht 6' (1.829 m)   Wt 209 lb 3.2 oz (94.9 kg)   SpO2 99%   BMI 28.37 kg/m   Physical Exam Vitals and nursing note reviewed.  Constitutional:      Appearance: Normal appearance.  HENT:     Head: Normocephalic.     Right Ear: External ear normal.     Left Ear: External ear normal.  Eyes:     Conjunctiva/sclera: Conjunctivae normal.  Cardiovascular:     Rate and Rhythm: Normal rate.  Pulmonary:     Effort: Pulmonary effort is normal. No respiratory distress.  Abdominal:     Palpations: Abdomen is soft.  Musculoskeletal:        General: Normal range of motion.  Skin:    General: Skin is warm.  Neurological:     Mental Status: He is alert and oriented to person, place, and time.  Psychiatric:        Mood and Affect: Mood normal.

## 2024-02-22 ENCOUNTER — Other Ambulatory Visit: Payer: Self-pay

## 2024-03-02 ENCOUNTER — Other Ambulatory Visit: Payer: Self-pay
